# Patient Record
Sex: Female | Born: 1960 | Race: White | Hispanic: No | Marital: Single | Smoking: Never smoker
Health system: Southern US, Community
[De-identification: ages and names within clinical notes are randomized; demographics above are authoritative.]

## PROBLEM LIST (undated history)

## (undated) DIAGNOSIS — E079 Disorder of thyroid, unspecified: Secondary | ICD-10-CM

## (undated) DIAGNOSIS — F329 Major depressive disorder, single episode, unspecified: Secondary | ICD-10-CM

## (undated) DIAGNOSIS — F32A Depression, unspecified: Secondary | ICD-10-CM

## (undated) HISTORY — DX: Depression, unspecified: F32.A

## (undated) HISTORY — PX: REDUCTION MAMMAPLASTY: SUR839

## (undated) HISTORY — PX: LAPAROSCOPIC GASTRIC BANDING: SHX1100

## (undated) HISTORY — DX: Disorder of thyroid, unspecified: E07.9

## (undated) HISTORY — DX: Major depressive disorder, single episode, unspecified: F32.9

---

## 2007-02-16 ENCOUNTER — Ambulatory Visit: Payer: Self-pay | Admitting: Pulmonary Disease

## 2007-02-20 ENCOUNTER — Ambulatory Visit (HOSPITAL_BASED_OUTPATIENT_CLINIC_OR_DEPARTMENT_OTHER): Admission: RE | Admit: 2007-02-20 | Discharge: 2007-02-20 | Payer: Self-pay | Admitting: Pulmonary Disease

## 2007-03-03 ENCOUNTER — Encounter: Admission: RE | Admit: 2007-03-03 | Discharge: 2007-03-03 | Payer: Self-pay | Admitting: Family Medicine

## 2007-03-09 ENCOUNTER — Telehealth (INDEPENDENT_AMBULATORY_CARE_PROVIDER_SITE_OTHER): Payer: Self-pay | Admitting: *Deleted

## 2007-03-09 ENCOUNTER — Encounter (INDEPENDENT_AMBULATORY_CARE_PROVIDER_SITE_OTHER): Payer: Self-pay | Admitting: *Deleted

## 2007-03-09 DIAGNOSIS — E039 Hypothyroidism, unspecified: Secondary | ICD-10-CM | POA: Insufficient documentation

## 2007-03-12 ENCOUNTER — Ambulatory Visit: Payer: Self-pay | Admitting: Pulmonary Disease

## 2007-03-18 ENCOUNTER — Ambulatory Visit: Payer: Self-pay | Admitting: Pulmonary Disease

## 2007-03-18 DIAGNOSIS — G478 Other sleep disorders: Secondary | ICD-10-CM

## 2007-06-09 ENCOUNTER — Ambulatory Visit (HOSPITAL_COMMUNITY): Admission: RE | Admit: 2007-06-09 | Discharge: 2007-06-09 | Payer: Self-pay | Admitting: Surgery

## 2007-06-10 ENCOUNTER — Ambulatory Visit (HOSPITAL_COMMUNITY): Admission: RE | Admit: 2007-06-10 | Discharge: 2007-06-10 | Payer: Self-pay | Admitting: Surgery

## 2007-06-29 ENCOUNTER — Encounter: Admission: RE | Admit: 2007-06-29 | Discharge: 2007-06-29 | Payer: Self-pay | Admitting: Surgery

## 2008-01-19 ENCOUNTER — Encounter: Admission: RE | Admit: 2008-01-19 | Discharge: 2008-02-09 | Payer: Self-pay | Admitting: Surgery

## 2008-02-01 ENCOUNTER — Ambulatory Visit (HOSPITAL_COMMUNITY): Admission: RE | Admit: 2008-02-01 | Discharge: 2008-02-02 | Payer: Self-pay | Admitting: *Deleted

## 2008-02-16 ENCOUNTER — Encounter: Admission: RE | Admit: 2008-02-16 | Discharge: 2008-02-16 | Payer: Self-pay | Admitting: Surgery

## 2008-03-03 ENCOUNTER — Ambulatory Visit (HOSPITAL_BASED_OUTPATIENT_CLINIC_OR_DEPARTMENT_OTHER): Admission: RE | Admit: 2008-03-03 | Discharge: 2008-03-03 | Payer: Self-pay | Admitting: Family Medicine

## 2008-03-03 ENCOUNTER — Ambulatory Visit: Payer: Self-pay | Admitting: Radiology

## 2008-04-26 ENCOUNTER — Encounter: Admission: RE | Admit: 2008-04-26 | Discharge: 2008-07-25 | Payer: Self-pay | Admitting: Surgery

## 2009-03-14 IMAGING — CR DG CHEST 2V
2 series · 2 of 2 positions shown · non-contrast
Comparison: None.

CLINICAL DATA: Preoperative screening for bariatric procedure, obesity. 
 CHEST - 2 VIEW:

[view not recorded (1 of 2)]
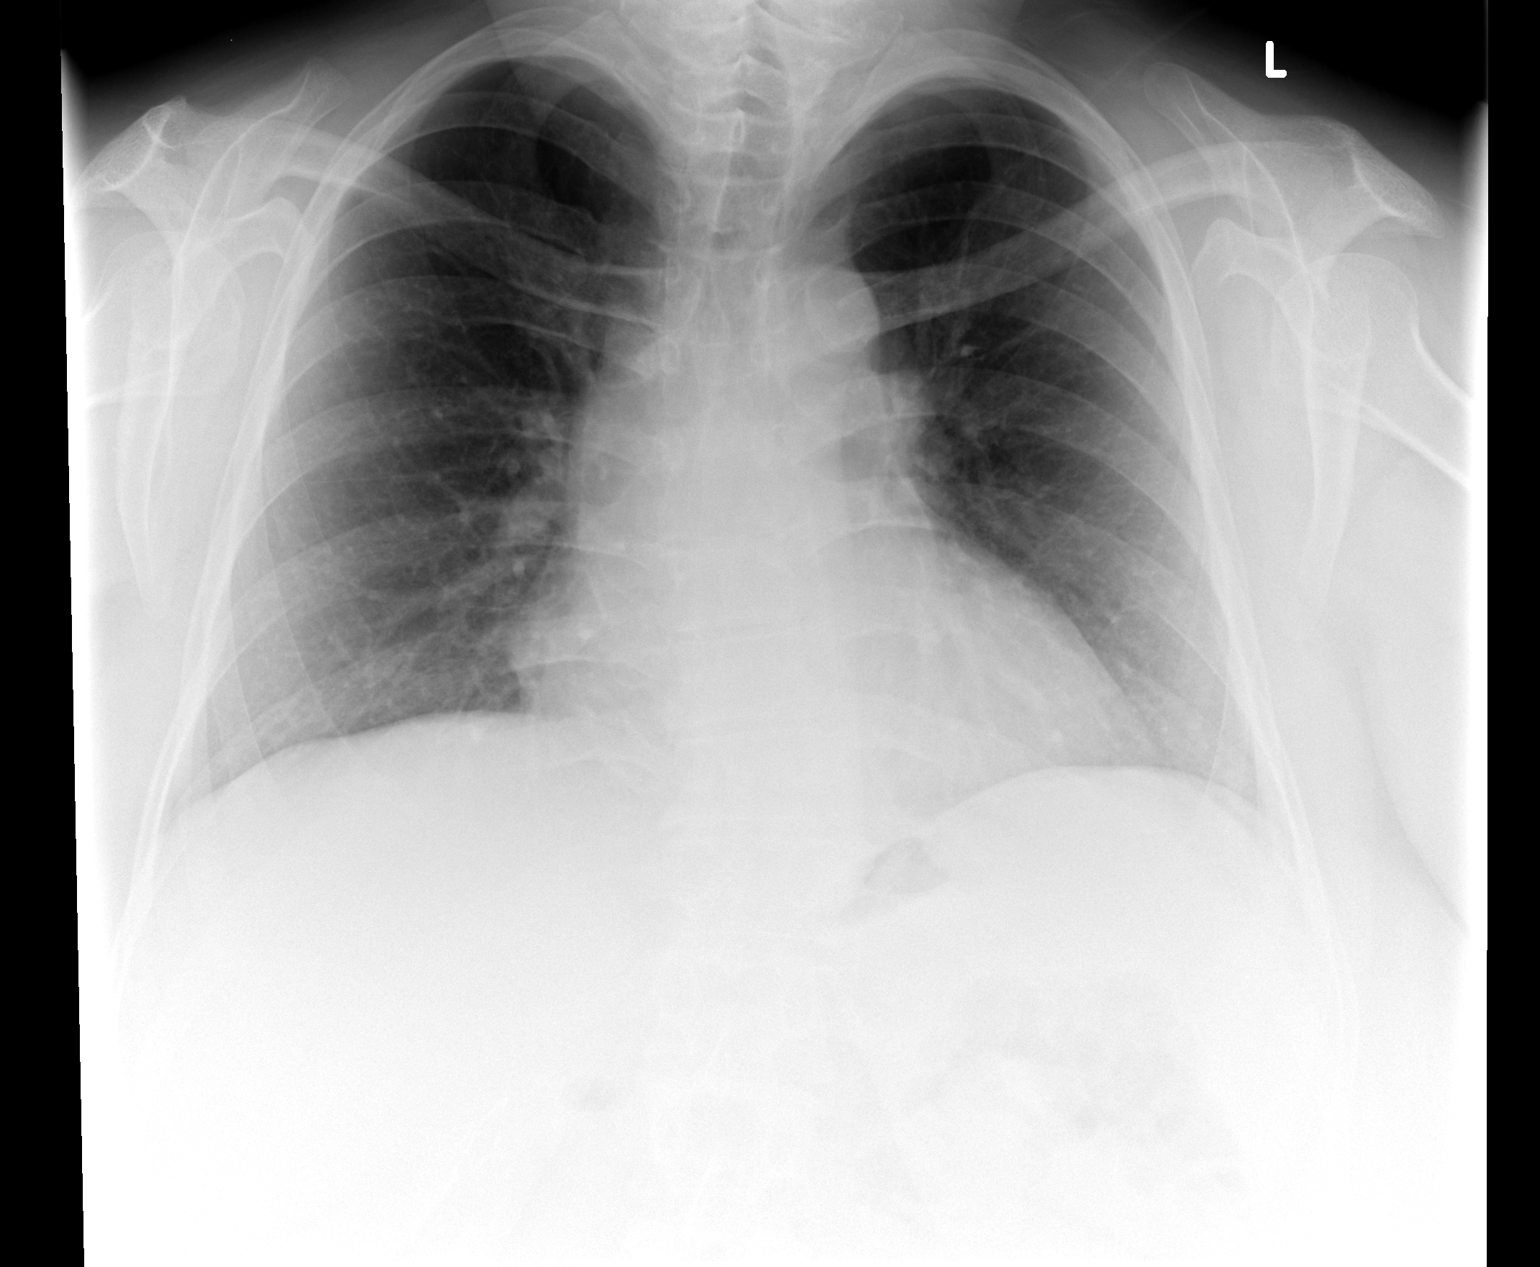

[view not recorded (2 of 2)]
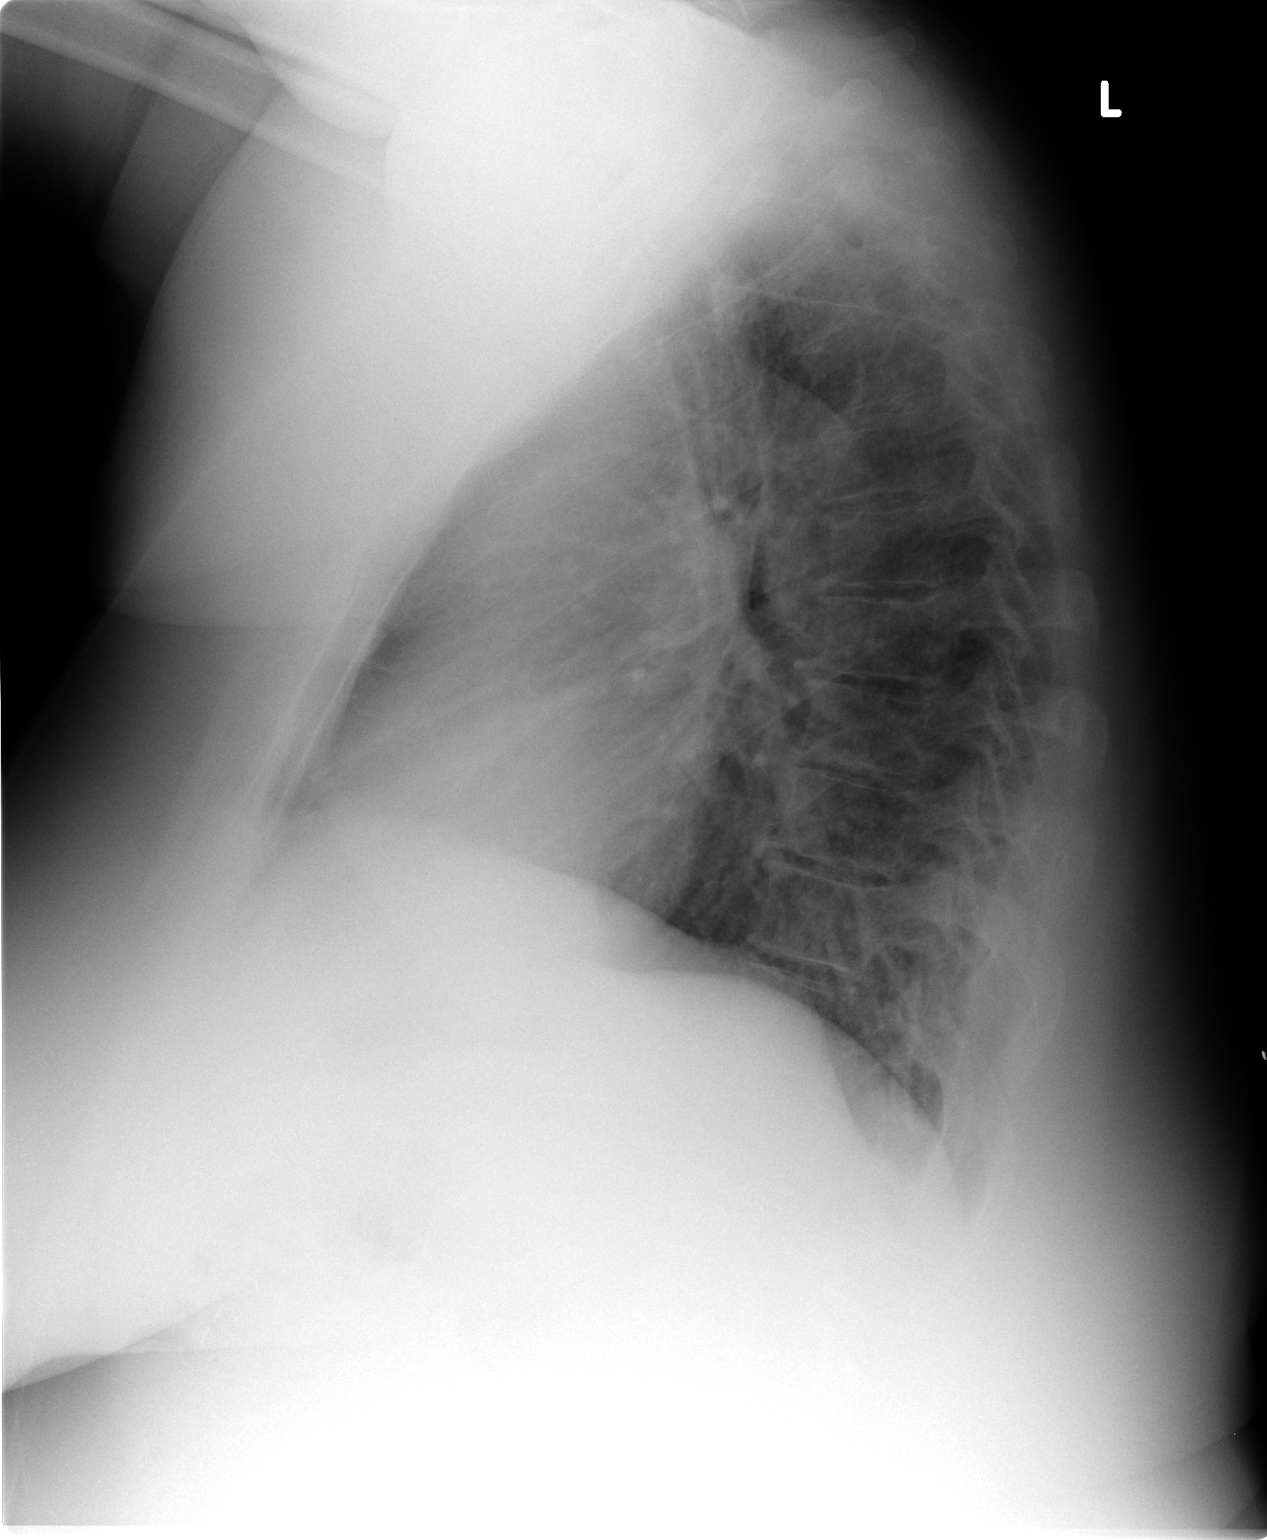

[2 of 2 positions shown; findings below may reference images not displayed]

FINDINGS: The cardiac silhouette, mediastinum, and pulmonary vasculature are within normal limits. Both lungs are clear.
IMPRESSION: Normal Chest x-ray.

## 2009-03-14 IMAGING — RF DG UGI W/ KUB
12 of 16 series · 17 of 24 positions shown · non-contrast
Comparison: none

CLINICAL DATA: Preoperative screening for bariatric procedure.  
 SINGLE CONTRAST UPPER GI:
 KUB shows a normal stool and bowel gas pattern.  Surgical clips are seen in the region of the gallbladder fossa.  There is a small hiatal hernia.  Otherwise, the esophagus, stomach, duodenal bulb and remainder of the C-loop have a normal appearance with no evidence of stricture, fixed filling defect, or ulceration.

[Series 1: run · 2 of 22 slices shown (1 of 11)]
[im 1/22]
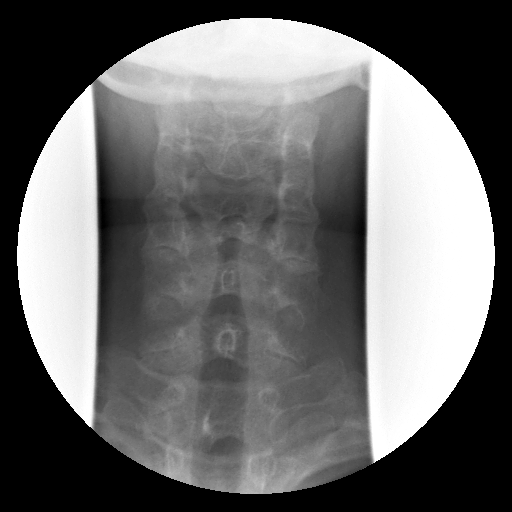
[im 22/22]
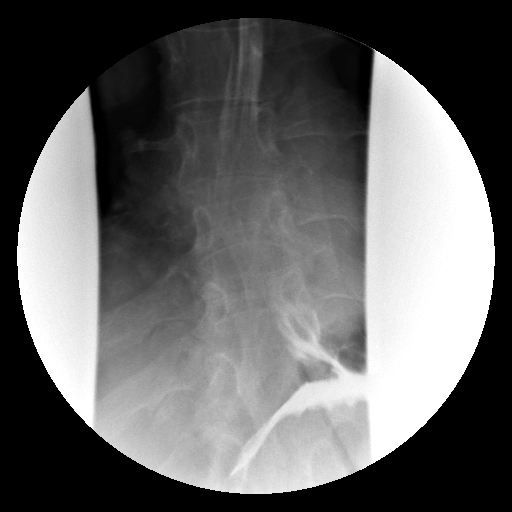

[Series 2: run · 5 of 31 slices shown (2 of 11)]
[im 1/31]
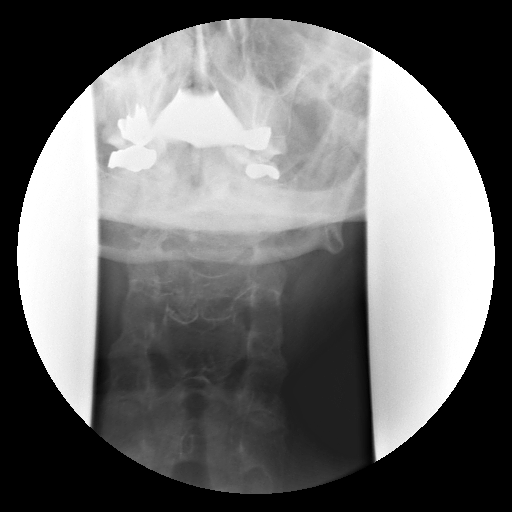
[im 6/31]
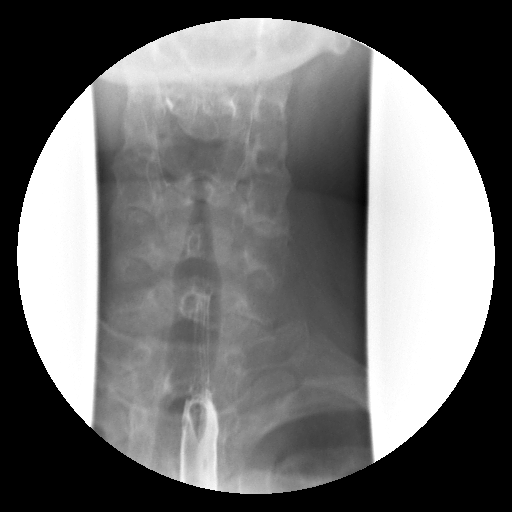
[im 16/31]
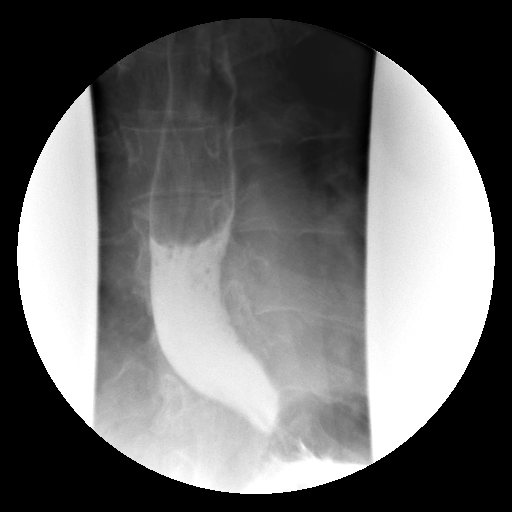
[im 21/31]
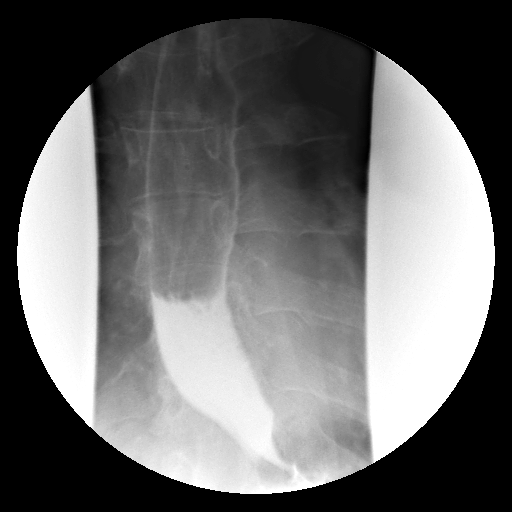
[im 31/31]
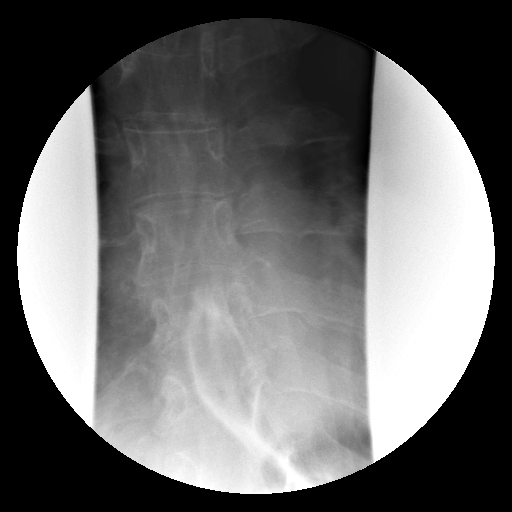

[Series 3: run · 1 of 10 slices shown (3 of 11)]
[im 1/10]
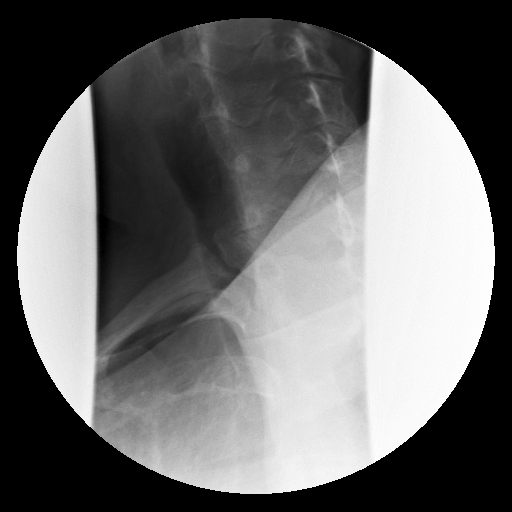

[Series 5: run · 1 of 1 slices shown (4 of 11)]
[im 1/1]
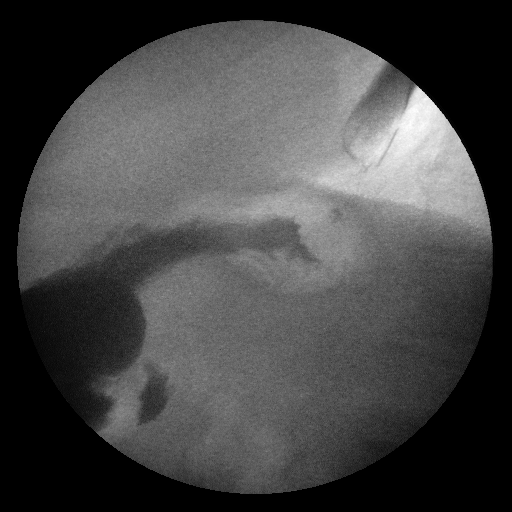

[Series 6: run · 1 of 1 slices shown (5 of 11)]
[im 1/1]
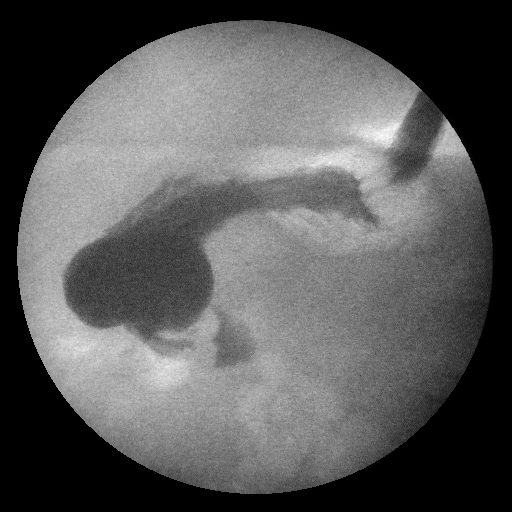

[Series 7: run · 1 of 1 slices shown (6 of 11)]
[im 1/1]
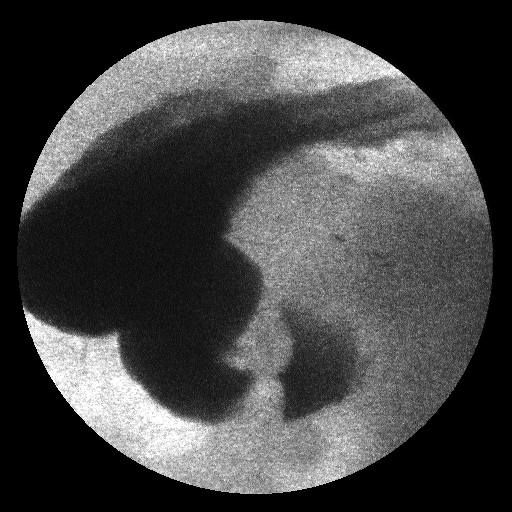

[Series 9: run · 1 of 1 slices shown (7 of 11)]
[im 1/1]
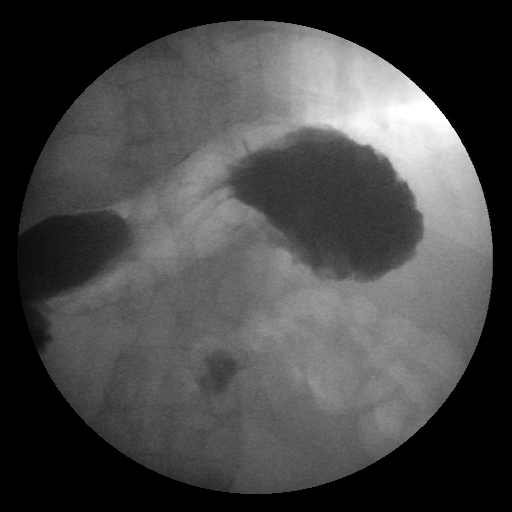

[Series 10: run · 1 of 1 slices shown (8 of 11)]
[im 1/1]
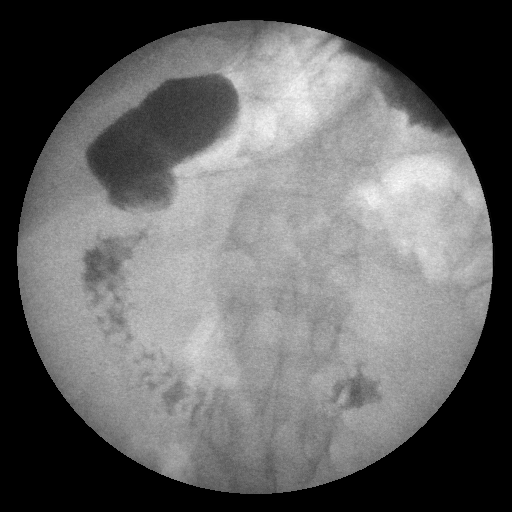

[Series 12: run · 1 of 1 slices shown (9 of 11)]
[im 1/1]
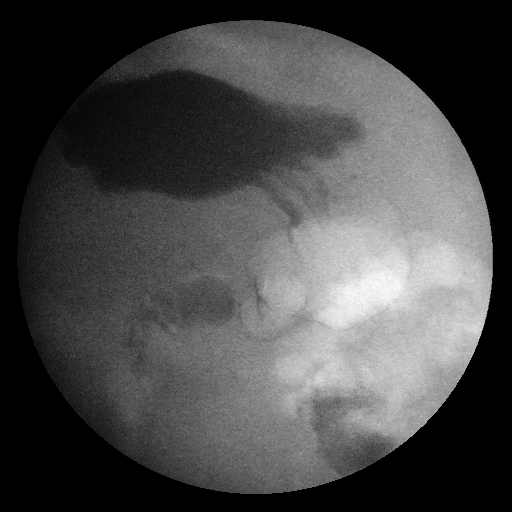

[Series 13: run · 1 of 1 slices shown (10 of 11)]
[im 1/1]
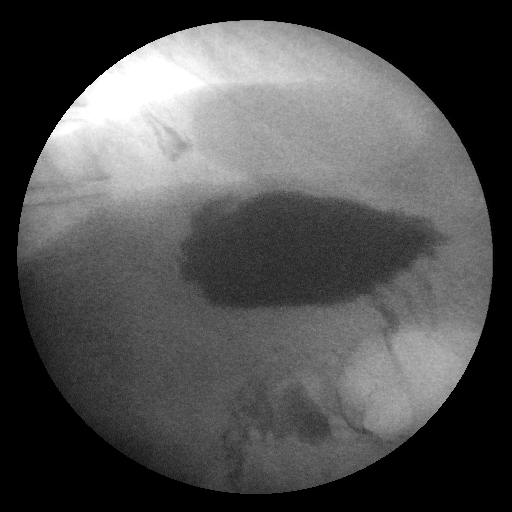

[Series 14: run · 1 of 1 slices shown (11 of 11)]
[im 1/1]
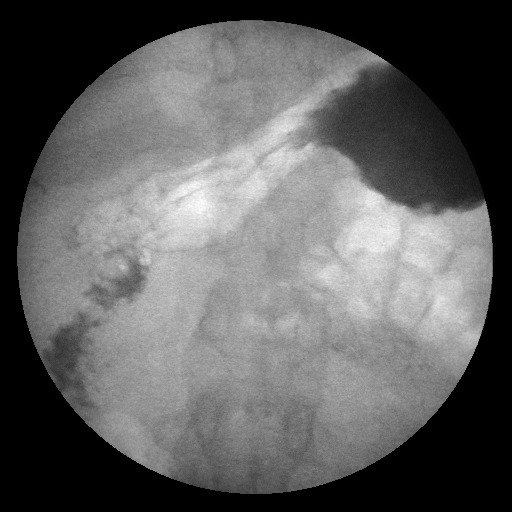

[Series 1002: view not recorded · 0.20mm/px · 1 of 1 slices shown]
[im 1/1]
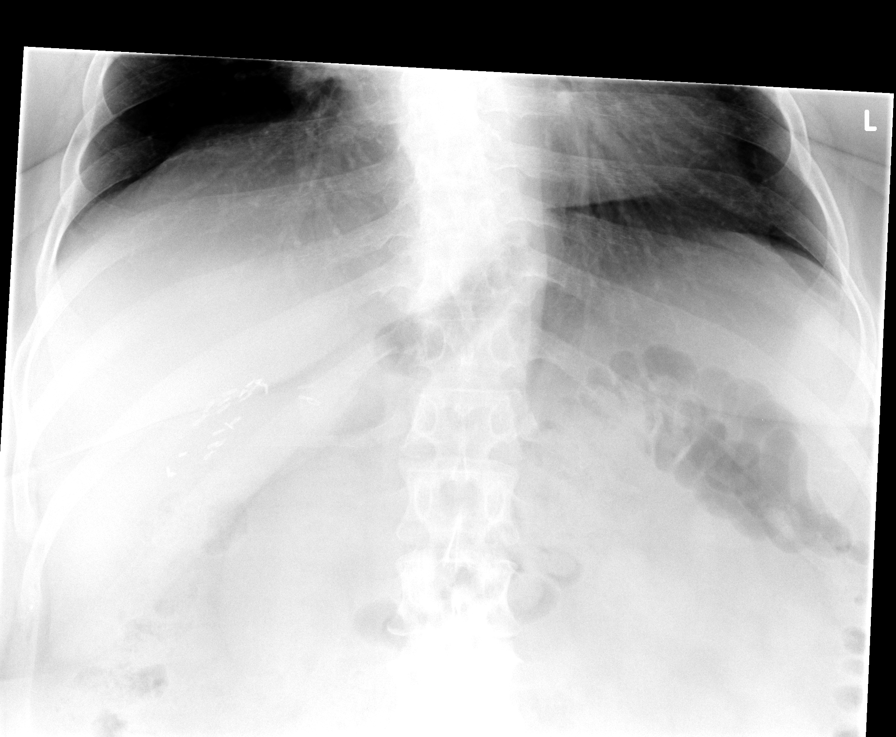

[17 of 24 positions shown; findings below may reference images not displayed]

IMPRESSION: Normal upper GI except for small hiatal hernia.

## 2009-04-26 ENCOUNTER — Ambulatory Visit: Payer: Self-pay | Admitting: Diagnostic Radiology

## 2009-04-26 ENCOUNTER — Ambulatory Visit (HOSPITAL_BASED_OUTPATIENT_CLINIC_OR_DEPARTMENT_OTHER): Admission: RE | Admit: 2009-04-26 | Discharge: 2009-04-26 | Payer: Self-pay | Admitting: Family Medicine

## 2009-09-04 ENCOUNTER — Encounter: Admission: RE | Admit: 2009-09-04 | Discharge: 2009-09-04 | Payer: Self-pay | Admitting: Obstetrics & Gynecology

## 2009-11-07 IMAGING — CR DG ABDOMEN 1V
1 series · 1 of 1 positions shown · non-contrast
Comparison: 06/09/2007

CLINICAL DATA: Adjustable gastric banding.

ABDOMEN - 1 VIEW

[t abdomen supine *]
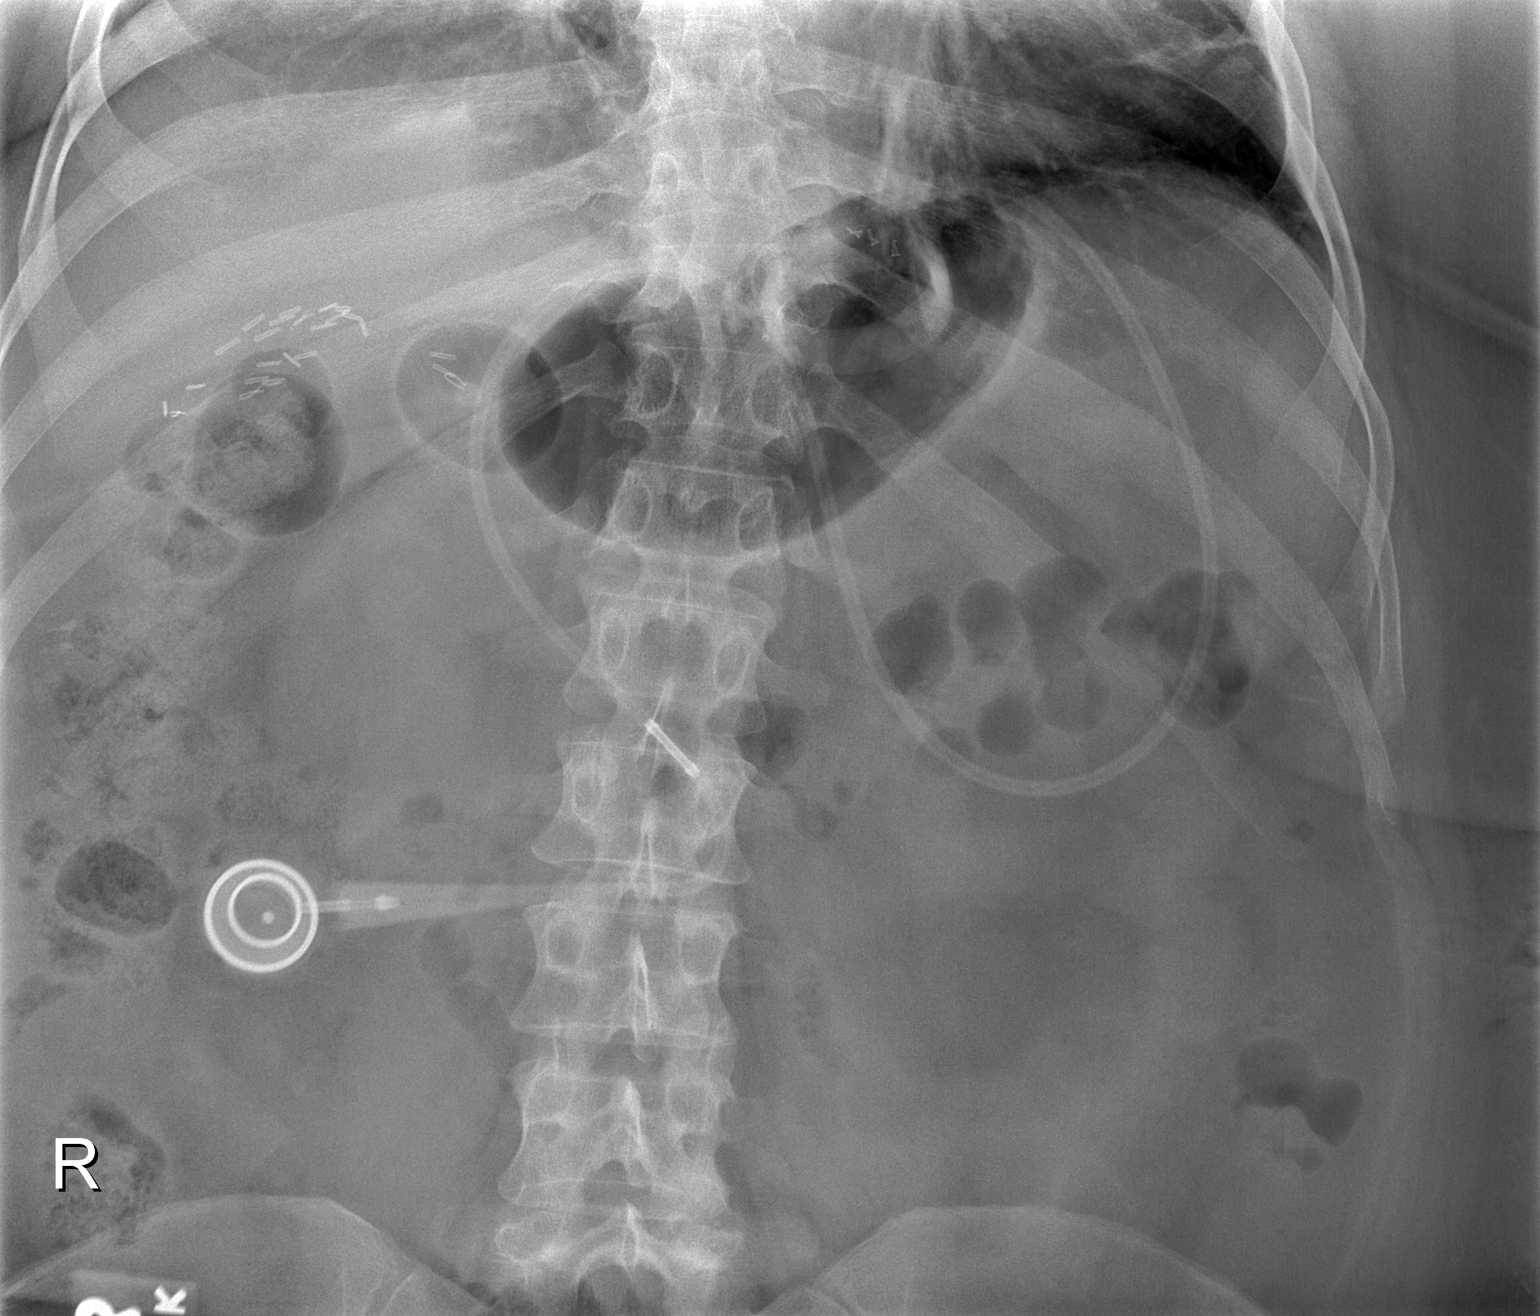

[1 of 1 positions shown; findings below may reference images not displayed]

FINDINGS: The gastric band projects along the cardia of the
stomach, with a predominantly horizontal orientation at
approximately the [DATE] orientation.  Plastic tubing extending from
the loop to a port projecting over the right abdomen is noted.

Gas and stool are noted in the colon.  No dilated bowel is evident.

There is a suggestion of a lingular subsegmental atelectasis.
IMPRESSION: 1.  Gastric band along the stomach cardia in the [DATE] orientation,
without discontinuity of tubing identified.
2.  Lingular subsegmental atelectasis is noted.

## 2010-04-24 ENCOUNTER — Other Ambulatory Visit (HOSPITAL_BASED_OUTPATIENT_CLINIC_OR_DEPARTMENT_OTHER): Payer: Self-pay | Admitting: Family Medicine

## 2010-04-24 DIAGNOSIS — Z139 Encounter for screening, unspecified: Secondary | ICD-10-CM

## 2010-05-04 ENCOUNTER — Ambulatory Visit (HOSPITAL_BASED_OUTPATIENT_CLINIC_OR_DEPARTMENT_OTHER)
Admission: RE | Admit: 2010-05-04 | Discharge: 2010-05-04 | Disposition: A | Discharge status: Home / Self Care | Payer: BC Managed Care – PPO | Source: Ambulatory Visit | Attending: Family Medicine | Admitting: Family Medicine

## 2010-05-04 ENCOUNTER — Encounter (HOSPITAL_BASED_OUTPATIENT_CLINIC_OR_DEPARTMENT_OTHER): Payer: Self-pay

## 2010-05-04 DIAGNOSIS — Z139 Encounter for screening, unspecified: Secondary | ICD-10-CM

## 2010-05-04 DIAGNOSIS — Z1231 Encounter for screening mammogram for malignant neoplasm of breast: Secondary | ICD-10-CM | POA: Insufficient documentation

## 2010-05-15 ENCOUNTER — Encounter: Payer: Self-pay | Admitting: Family Medicine

## 2010-08-14 NOTE — Op Note (Signed)
Lindsay [name], Lindsay [name]                ACCOUNT NO.:  [id number]   MEDICAL RECORD NO.:  [id number]          PATIENT TYPE:  AMB   LOCATION:  DAY                          FACILITY:  [facility]   PHYSICIAN:  Sandria Bales. Ezzard Standing, M.D.  DATE OF BIRTH:  04-26-1960   DATE OF PROCEDURE:  02/01/2008  DATE OF DISCHARGE:                               OPERATIVE REPORT   Date of Surgery ??   PREOPERATIVE DIAGNOSIS:  Morbid obesity (weight of 289 with a BMI of  45.3), hiatal hernia.   POSTOPERATIVE DIAGNOSIS:  Morbid obesity (weight of 289, BMI of 45.3),  approximately 4 cm hiatal hernia.   PROCEDURE:  Lap band with AP standard band, subcu port placement lateral  to the wound, repair of hiatal hernia with two posterior sutures.   SURGEON:  Dr. Ovidio Kin.   FIRST ASSISTANT:  Dr.  Luretha Murphy.   ANESTHESIA:  General endotracheal.   ESTIMATED BLOOD LOSS:  Blood loss was minimal.   INDICATIONS FOR PROCEDURE:  Lindsay [name] is a 50 year old white female who  is a patient of Dr. Jenita Seashore at [facility] who has  been obese much of her adult life.  She has been through our preop  bariatric program and is interested in a lap band placement.  She  understands the risks of surgery which include, but are not limited to,  bleeding, infection, injury to the bowel, slippage of the band, erosion  of the band and long-term nutritional consequences.   She also on the upper GI was noted to have a small hiatal hernia.  I  discussed with her that we would try to examine the hiatus at the time  of surgery and that she would be better off with this repaired but the  it does carry the risks of hiatal hernia repair which include bleeding,  injury to the bowel or recurrence of a hiatal hernia.   OPERATIVE NOTE:  The patient placed in supine position, given a general  endotracheal anesthetic.  She was given 1 gram of Ancef at initiation of  procedure, had PAS stockings in place.  A time-out was held  identifying  the patient and the procedure.   I accessed her abdominal cavity in the left upper quadrant with an  Optiview.  I did abdominal exploration.  I could see the left lobe of  the liver well, the right lobe was secured with adhesions under the  prior right subcostal incision for her gallbladder.   I placed an additional trocar sort of the left paramedian circle and  through this is where I dissected and I did cut down these adhesions  under direct visualization.  I spent about 15 minutes.  I used both  scissors and Bovie electrocautery on these.  It was all omentum.  I saw  no bowel caught up in the adhesions.   I placed three additional trocars, a 15 mm right subcostal, an 11 mm  right paramedian, 5 mm substernal trocar subxiphoid and a 5-mm a left  lateral abdominal trocar.   I then turned my attention to the stomach  which looked normal.  Then I  could see again the left lobe of the liver but not the right lobe  because of the adhesions which was unremarkable but other than the  adhesions I mentioned, there was nothing else remarkable.  I then placed  the Nathanson's liver retractor, retracted the left lobe of the liver  anteriorly.  She did have a hiatal hernia which was noticeable at the  time of initial dissection.  I then going and passed the sizing tube  down.  We blew up the balloon with 15 mL and this was pulled up through  the hiatus so it did give me an idea at least how big the hiatal hernia  was which looked maybe about 4 cm in size.   So I dissected along the left crus and the right crus.  Of note, her  right crus was somewhat either attenuated or less well developed than  the left crus.  I went back to posteriorly where the two crus come  together.  I placed two sutures of #1 Ethibond  posterior.  This closed  the hiatus down to about to 2.5 cm.  I repassed the sizing balloon, blew  up the balloon with 15 mL of air.  I pulled it back at this time and   cinched it.  The two posterior sutures did a good job of closing down  the hiatal hernia.   I then used an AP standard band.  I tried to pass this is a pars  flaccida approach posterior to the gastroesophageal junction and cinched  it down over the sizing tube and then withdrew the sizing tube.  I then  imbricated the stomach over the anterior and lateral aspect of the band  with 3 sutures of 0 Ethibond suture.  The imbrication looked good.  The  band seemed to lie in the right place.  The hiatal hernia repair looked  good.  There was no bleeding.  I then pulled out the tubing through the  right upper quadrant and removed all the trocars in turn.  There was no  bleeding at any trocar site.   Because of her weight and distribution I thought she may be better off  with attempted subcu port, so I developed a pocket lateral to my  incision.  It maybe goes lateral maybe 2 or 3 cm.  I placed a pocket in  there.  I did put a kind of stitch to close down the pocket but I did  not do kind of any anchoring sutures to see how this would do.   I then closed this wound with 5-0 either Monocryl or Vicryl suture.  Painted each wound with tincture of Benzoin and steri-stripped it.   The patient tolerated the procedure well, was transported to the  recovery room in good condition.      Sandria Bales. Ezzard Standing, M.D.  Electronically Signed     DHN/MEDQ  D:  02/01/2008  T:  02/01/2008  Job:  [id number]   cc:   Almedia Balls  Fax: [phone]

## 2010-08-14 NOTE — Procedures (Signed)
Lindsay [name], Lindsay [name] NO.:  [id number]   MEDICAL RECORD NO.:  [id number]          PATIENT TYPE:  OUT   LOCATION:  SLEEP CENTER                 FACILITY:  [facility]   PHYSICIAN:  Barbaraann Share, MD,FCCPDATE OF BIRTH:  02/26/63   DATE OF STUDY:  02/20/2007                            NOCTURNAL POLYSOMNOGRAM   REFERRING PHYSICIAN:   INDICATION FOR STUDY:  Hypersomnia with sleep apnea.   EPWORTH SLEEPINESS SCORE:  3   MEDICATIONS:   SLEEP ARCHITECTURE:  The patient had a total sleep time of 360 minutes  with no REM and minimal slow wave sleep.  Sleep onset latency was  slightly increased at 31 minutes and sleep efficiency was decreased at  81%.   RESPIRATORY DATA:  The patient was found to have six hypopneas and no  apneas for an apnea/hypopnea index of 1 event per hour.  There was mild  snoring noted throughout.   OXYGEN DATA:  There was O2 desaturation as low as 92% with the patient's  obstructive events.   CARDIAC DATA:  No clinically significant arrhythmias were noted.   MOVEMENT-PARASOMNIA:  None.   IMPRESSIONS-RECOMMENDATIONS:  1. Small numbers of events which do not meet the apnea/hypopnea index      criteria for the obstructive sleep apnea syndrome.  2. No abnormal leg movements or abnormal behaviors were noted during      the study.      Barbaraann Share, MD,FCCP  Diplomate, American Board of Sleep  Medicine  Electronically Signed     KMC/MEDQ  D:  03/12/2007 16:03:55  T:  03/13/2007 10:42:03  Job:  [id number]

## 2010-08-14 NOTE — Assessment & Plan Note (Signed)
[facility]                             PULMONARY OFFICE NOTE   NAME:[name], [name]                         MRN:          [id number]  DATE:02/16/2007                            DOB:          11/29/1960    REFERRING PHYSICIAN:  Dr. Hal Neer   HISTORY OF PRESENT ILLNESS:  The patient is a 50 year old female who I  have been asked to see for possible obstructive sleep apnea.  The  patient recently underwent foot surgery and developed some kind of  respiratory problem postoperatively that she has not been able to  clarify for me.  She states it has something to do with sedation, and I  suspect that it was related to hypoventilation and possibly obstructive  sleep apnea.  The patient states that she typically goes to bed between  10:00 and 11:00 and gets up between 8:00 and 9:00 to start her day.  She  is not rested upon arising.  She will typically awaken 3-5 times a night  for unknown reasons.  She does not know if she snores and denies any  choking arousals.  She does not have a bed partner to know if she has  pauses in her breathing during sleep. The patient works in [employer] ge[employer] and  feels that she does not have time to get sleepy during the day.  She  does not doze in the evenings when she gets home to sit down and read or  watch TV.  She has some sleepiness on weekends with reading as well as  some sleepiness with driving long distances.  Of note, her weight has  not changes over the last 2 years.   PAST MEDICAL HISTORY:  1. Cholecystectomy in 1990.  2. History of hypothyroidism for which she is on medication and tells      me that her levels were recently checked and were within normal      limits.   ALLERGIES:  The patient has an allergy to PENICILLIN.   SOCIAL HISTORY:  She is single.  She does not have children.  She has  never smoked.   FAMILY HISTORY:  Family history is remarkable for her mother having lung  cancer and father having prostate  cancer.   REVIEW OF SYSTEMS:  As per history of present illness, also see patient  intake form documented in the chart.   PHYSICAL EXAMINATION:  GENERAL:  She is an morbidly obese white female  in no acute distress.  VITAL SIGNS:  Blood pressure is 136/78, pulse 85, temperature is 98.7  and weight is 311 pounds.  O2 saturation on room air is 100%.  HEENT:  Pupils are equal, round and reactive to light and accommodation.  Extraocular muscles are intact.  Nares show mild septal deviation to the  left.  Oropharynx shows elongated soft palate, but a normal uvula.  She  does have a very small oral cavity.  NECK:  Supple without JVD or lymphadenopathy.  There is no palpable  thyromegaly.  CHEST:  Totally clear.  CARDIAC:  Exam reveals regular rate and rhythm.  No murmurs, rubs, or  gallops.  ABDOMEN:  Soft and nontender with good bowel sounds.  GENITAL:  Not done and not indicated.  RECTAL:  Not done and not indicated.  BREASTS:  Not done and not indicated.  EXTREMITIES:  Lower extremities without significant edema.  Pulses are  intact distally.  NEUROLOGIC:  Alert and oriented with no obvious motor deficits.   IMPRESSION:  1. Possible obstructive sleep apnea.  The patient has nonrestorative      sleep, has frequent awakening during the night for unknown reasons,      and clearly has significant obesity and abnormal upper airway      anatomy on exam.  I do think that she is at significant risk for      having obstructive sleep apnea.  I do not have the records from her      recent surgery, but I suspect that her podiatrist referred her      because of witnessed pauses/apneas in the postoperative period.  I      have had a long discussion with the patient about the      pathophysiology of sleep apnea including the short-term quality of      life issues and the long-term cardiovascular issues.  I really do      think that she could benefit from a sleep study, not only to rule      out  the possibility of obstructive sleep apnea, but also to look      and see if some of her awakenings are due to other things such as      periodic leg movements or other abnormal behaviors.  The patient is      agreeable to this approach.   PLAN:  1. Schedule for nocturnal polysomnography.  2. Work on weight loss.  3. The patient will follow up after the above.     Barbaraann Share, MD,FCCP  Electronically Signed    KMC/MedQ  DD: 02/16/2007  DT: 02/17/2007  Job #: [id number]   cc:   Dot Been, DPM in [facility]

## 2011-01-01 LAB — CBC
HCT: 42.8
MCHC: 32.7
MCHC: 34.3
MCV: 86.7
MCV: 87.1
Platelets: 340
RBC: 4.38
RDW: 13.3
RDW: 13.5

## 2011-01-01 LAB — COMPREHENSIVE METABOLIC PANEL
ALT: 22
AST: 23
Albumin: 3.9
CO2: 27
Calcium: 9
Chloride: 106
GFR calc Af Amer: >60
Glucose, Bld: 102 — ABNORMAL HIGH
Sodium: 141

## 2011-01-01 LAB — DIFFERENTIAL
Basophils Absolute: 0.1
Basophils Relative: 2 — ABNORMAL HIGH
Eosinophils Absolute: 0.1
Eosinophils Relative: 4
Monocytes Absolute: 0.4
Monocytes Absolute: 0.8
Neutro Abs: 4.6
Neutrophils Relative %: 65

## 2011-01-01 LAB — PREGNANCY, URINE: Preg Test, Ur: NEGATIVE

## 2011-05-03 ENCOUNTER — Other Ambulatory Visit (HOSPITAL_BASED_OUTPATIENT_CLINIC_OR_DEPARTMENT_OTHER): Payer: Self-pay | Admitting: Family Medicine

## 2011-05-03 DIAGNOSIS — Z1231 Encounter for screening mammogram for malignant neoplasm of breast: Secondary | ICD-10-CM

## 2011-05-07 ENCOUNTER — Ambulatory Visit (HOSPITAL_BASED_OUTPATIENT_CLINIC_OR_DEPARTMENT_OTHER)
Admission: RE | Admit: 2011-05-07 | Discharge: 2011-05-07 | Disposition: A | Discharge status: Home / Self Care | Payer: BC Managed Care – PPO | Source: Ambulatory Visit | Attending: Family Medicine | Admitting: Family Medicine

## 2011-05-07 DIAGNOSIS — Z1231 Encounter for screening mammogram for malignant neoplasm of breast: Secondary | ICD-10-CM

## 2011-07-25 ENCOUNTER — Telehealth (INDEPENDENT_AMBULATORY_CARE_PROVIDER_SITE_OTHER): Payer: Self-pay | Admitting: Surgery

## 2011-07-25 NOTE — Telephone Encounter (Signed)
07/25/11 mailed recall letter to patient for bariatric surgery follow-up. Advised patient to call [facility] @ [phone] to schedule an appointment. cef 

## 2011-11-22 ENCOUNTER — Ambulatory Visit (INDEPENDENT_AMBULATORY_CARE_PROVIDER_SITE_OTHER): Payer: BC Managed Care – PPO | Admitting: Surgery

## 2011-11-22 ENCOUNTER — Encounter (INDEPENDENT_AMBULATORY_CARE_PROVIDER_SITE_OTHER): Payer: Self-pay | Admitting: Surgery

## 2011-11-22 VITALS — BP 134/84 | HR 68 | Temp 97.0°F | Resp 16 | Ht 67.0 in | Wt 268.6 lb

## 2011-11-22 DIAGNOSIS — Z9884 Bariatric surgery status: Secondary | ICD-10-CM

## 2011-11-22 NOTE — Progress Notes (Signed)
[facility]  Ovidio Kin, MD,  FACS [address] [place]  [address] [place], [place]    [address] Phone:  [phone] FAX:  [phone]   Re:   Lindsay Snow DOB:   1960-04-12 MRN:   [id number]  ASSESSMENT AND PLAN: 1.  History of lap band - AP standard - placed 02/01/2008  Initial weight - 307, BMI - 48.16  She has gained 25+ pounds since we last saw her.  Also, it has been a while.  I withdrew all the fluid (-6.0 cc)  She took some water, but could not tell much difference.  She will call Monday and tell us how she is doing.  Return to the office in 4 weeks.  2.  Feels that food is stuck in lap band.  3.  History of depression 4.  Hypothyroid, on meds. 5.  History of foot problems.  HISTORY OF PRESENT ILLNESS: Lindsay Snow is a 52 y.o. (DOB: Oct 18, 1960)  white female who is a patient of KELLY,SAM, MD and comes to me today for food stuck above lap band.  She had been doing well and at one time she had lost down to about 230.  She was eating hot dogs yesterday.  But she feels that one is stuck.  She cannot eat food and when she takes water, she has to go slow.  If she drinks too fast, she feels some swishing in her stomach.  She had not seen [place] in a while.  I discussed with her that we like to see our patients at least annually.  We also talked about exercise, which she is not doing.  Social History: She works at [employer]  PHYSICAL EXAM: BP 134/84  Pulse 68  Temp 97 F (36.1 C)  Resp 16  Ht 5\' 7"  (1.702 m)  Wt 268 lb 9.6 oz (121.836 kg)  BMI 42.07 kg/m2  Abdomen:  Soft.  No mass. No hernia.  Procedure note:  I accessed the lap band and withdrew 6.0 cc.  There should be no fluid in the lap band.  DATA REVIEWED: Old chart.   Ovidio Kin, MD, FACS Office:  [phone]

## 2011-12-19 ENCOUNTER — Encounter (INDEPENDENT_AMBULATORY_CARE_PROVIDER_SITE_OTHER): Payer: Self-pay

## 2011-12-19 ENCOUNTER — Ambulatory Visit (INDEPENDENT_AMBULATORY_CARE_PROVIDER_SITE_OTHER): Payer: BC Managed Care – PPO | Admitting: Physician Assistant

## 2011-12-19 VITALS — BP 134/76 | HR 114 | Temp 99.1°F | Ht 67.0 in | Wt 272.2 lb

## 2011-12-19 DIAGNOSIS — Z4651 Encounter for fitting and adjustment of gastric lap band: Secondary | ICD-10-CM

## 2011-12-19 NOTE — Progress Notes (Signed)
  HISTORY: Lindsay Snow is a 51 y.o.female who received an AP-Standard lap-band in November 2009 by Dr. Ezzard Standing. She comes in after having all fluid removed by Dr. Ezzard Standing one month ago for persistent feeling of something stuck. She said since then she's had no further difficulty swallowing but notices an increase in hunger and portion sizes, as expected.  VITAL SIGNS: Filed Vitals:   12/19/11 1403  BP: 134/76  Pulse: 114  Temp: 99.1 F (37.3 C)    PHYSICAL EXAM: Physical exam reveals a very well-appearing 51 y.o.female in no apparent distress Neurologic: Awake, alert, oriented Psych: Flat affect, conversant Respiratory: Breathing even and unlabored. No stridor or wheezing Abdomen: Soft, nontender, nondistended to palpation. Incisions well-healed. No incisional hernias. Port easily palpated. Extremities: Atraumatic, good range of motion.  ASSESMENT: 51 y.o.  female  s/p AP-Standard lap-band.   PLAN: The patient's port was accessed with a 20G Huber needle without difficulty. Clear fluid was aspirated and 5 mL saline was added to the port to give a total predicted volume of 6 mL. The patient was able to swallow water without difficulty following the procedure and was instructed to take clear liquids for the next 24-48 hours and advance slowly as tolerated.

## 2011-12-19 NOTE — Patient Instructions (Signed)
Take clear liquids tonight. Thin protein shakes are ok to start tomorrow morning. Slowly advance your diet thereafter. Call us if you have persistent vomiting or regurgitation, night cough or reflux symptoms. Return as scheduled or sooner if you notice no changes in hunger/portion sizes.  

## 2012-01-16 ENCOUNTER — Ambulatory Visit (INDEPENDENT_AMBULATORY_CARE_PROVIDER_SITE_OTHER): Payer: BC Managed Care – PPO | Admitting: Physician Assistant

## 2012-01-16 ENCOUNTER — Encounter (INDEPENDENT_AMBULATORY_CARE_PROVIDER_SITE_OTHER): Payer: Self-pay

## 2012-01-16 VITALS — BP 122/78 | HR 83 | Temp 98.2°F | Ht 67.0 in | Wt 272.8 lb

## 2012-01-16 DIAGNOSIS — Z4651 Encounter for fitting and adjustment of gastric lap band: Secondary | ICD-10-CM

## 2012-01-16 NOTE — Patient Instructions (Signed)
Take clear liquids tonight. Thin protein shakes are ok to start tomorrow morning. Slowly advance your diet thereafter. Call us if you have persistent vomiting or regurgitation, night cough or reflux symptoms. Return as scheduled or sooner if you notice no changes in hunger/portion sizes.  

## 2012-01-16 NOTE — Progress Notes (Signed)
  HISTORY: Lindsay Snow is a 51 y.o.female who received an AP-Standard lap-band in November 2009 by Dr. Ezzard Standing. She comes in with stable weight but she notices she can eat more than desired. She has no regurgitation or reflux symptoms. She wants a fill today. She complains of a rash to her abdomen and left arm. She says it doesn't itch and she's had no fevers.  VITAL SIGNS: Filed Vitals:   01/16/12 1121  BP: 122/78  Pulse: 83  Temp: 98.2 F (36.8 C)    PHYSICAL EXAM: Physical exam reveals a very well-appearing 51 y.o.female in no apparent distress Neurologic: Awake, alert, oriented Psych: Flat affect, conversant Respiratory: Breathing even and unlabored. No stridor or wheezing Abdomen: Soft, nontender, nondistended to palpation. Incisions well-healed. No incisional hernias. Port easily palpated. Extremities: Atraumatic, good range of motion. Skin: Warm, Dry. Papular rash to left upper abdomen and left upper extremity. No petechiae.  ASSESMENT: 51 y.o.  female  s/p AP-Standard lap-band.   PLAN: The patient's port was accessed with a 20G Huber needle without difficulty. Clear fluid was aspirated and 0.5 mL saline was added to the port to give a total predicted volume of 6.5 mL. The patient was able to swallow water without difficulty following the procedure and was instructed to take clear liquids for the next 24-48 hours and advance slowly as tolerated. I advised her to contact her PCP regarding this rash.

## 2012-05-20 ENCOUNTER — Other Ambulatory Visit (HOSPITAL_BASED_OUTPATIENT_CLINIC_OR_DEPARTMENT_OTHER): Payer: Self-pay | Admitting: Family Medicine

## 2012-05-20 DIAGNOSIS — Z1231 Encounter for screening mammogram for malignant neoplasm of breast: Secondary | ICD-10-CM

## 2012-05-25 ENCOUNTER — Ambulatory Visit (HOSPITAL_BASED_OUTPATIENT_CLINIC_OR_DEPARTMENT_OTHER)
Admission: RE | Admit: 2012-05-25 | Discharge: 2012-05-25 | Disposition: A | Discharge status: Home / Self Care | Payer: BC Managed Care – PPO | Source: Ambulatory Visit | Attending: Family Medicine | Admitting: Family Medicine

## 2012-05-25 DIAGNOSIS — Z1231 Encounter for screening mammogram for malignant neoplasm of breast: Secondary | ICD-10-CM | POA: Insufficient documentation

## 2013-06-17 ENCOUNTER — Other Ambulatory Visit (HOSPITAL_BASED_OUTPATIENT_CLINIC_OR_DEPARTMENT_OTHER): Payer: Self-pay | Admitting: Family Medicine

## 2013-06-17 DIAGNOSIS — Z1231 Encounter for screening mammogram for malignant neoplasm of breast: Secondary | ICD-10-CM

## 2013-06-23 ENCOUNTER — Ambulatory Visit (HOSPITAL_BASED_OUTPATIENT_CLINIC_OR_DEPARTMENT_OTHER)
Admission: RE | Admit: 2013-06-23 | Discharge: 2013-06-23 | Disposition: A | Discharge status: Home / Self Care | Payer: BC Managed Care – PPO | Source: Ambulatory Visit | Attending: Family Medicine | Admitting: Family Medicine

## 2013-06-23 DIAGNOSIS — Z1231 Encounter for screening mammogram for malignant neoplasm of breast: Secondary | ICD-10-CM

## 2015-02-01 ENCOUNTER — Telehealth (HOSPITAL_COMMUNITY): Payer: Self-pay

## 2015-02-01 NOTE — Telephone Encounter (Signed)
This patient is overdue for recommended [facility]. A letter was mailed to the address on file on 01/05/15 from both [place] & [facility]. Letter has been returned undeliverable, unable to forward to new address in [place]. Call attempted today to reestablish post-op care with [facility]. Pt answered & confirms that she now lives in [place], [place] but has not transferred care to a local provider for lap band f/u. Has been encouraged to find someone locally & contact [facility] to have records transferred.   Jim LikeAmanda [facility]. [facility] Coordinator [phone]

## 2016-02-08 ENCOUNTER — Encounter (HOSPITAL_COMMUNITY): Payer: Self-pay

## 2016-05-29 ENCOUNTER — Other Ambulatory Visit (HOSPITAL_BASED_OUTPATIENT_CLINIC_OR_DEPARTMENT_OTHER): Payer: Self-pay | Admitting: Family Medicine

## 2016-05-29 DIAGNOSIS — Z1231 Encounter for screening mammogram for malignant neoplasm of breast: Secondary | ICD-10-CM

## 2016-05-30 ENCOUNTER — Encounter (HOSPITAL_BASED_OUTPATIENT_CLINIC_OR_DEPARTMENT_OTHER): Payer: Self-pay

## 2016-05-30 ENCOUNTER — Ambulatory Visit (HOSPITAL_BASED_OUTPATIENT_CLINIC_OR_DEPARTMENT_OTHER)
Admission: RE | Admit: 2016-05-30 | Discharge: 2016-05-30 | Disposition: A | Discharge status: Home / Self Care | Payer: BLUE CROSS/BLUE SHIELD | Source: Ambulatory Visit | Attending: Family Medicine | Admitting: Family Medicine

## 2016-05-30 DIAGNOSIS — Z1231 Encounter for screening mammogram for malignant neoplasm of breast: Secondary | ICD-10-CM | POA: Insufficient documentation

## 2016-06-03 ENCOUNTER — Encounter (HOSPITAL_BASED_OUTPATIENT_CLINIC_OR_DEPARTMENT_OTHER): Payer: Self-pay | Admitting: Radiology

## 2016-09-04 ENCOUNTER — Encounter (HOSPITAL_COMMUNITY): Payer: Self-pay

## 2016-09-20 ENCOUNTER — Telehealth (HOSPITAL_COMMUNITY): Payer: Self-pay

## 2016-09-20 NOTE — Telephone Encounter (Signed)
This patient is overdue for recommended follow-[facility]. A letter was mailed to the address on file 09/04/16 from both [place] & [facility] in attempt to reestablish post-op care. The letter included a patient survey which was [facility]  Patient declined an appointment advising she does not want a lap band fill & therefore does not see need for regular visits.  Copy of the survey was shared with Dario GuardianFrances Jackson at [facility] so she may file in patients office chart.
